# Patient Record
Sex: Female | Born: 1955 | Race: White | Hispanic: No | Smoking: Never smoker
Health system: Southern US, Community
[De-identification: ages and names within clinical notes are randomized; demographics above are authoritative.]

## PROBLEM LIST (undated history)

## (undated) DIAGNOSIS — M1990 Unspecified osteoarthritis, unspecified site: Secondary | ICD-10-CM

## (undated) DIAGNOSIS — I10 Essential (primary) hypertension: Secondary | ICD-10-CM

## (undated) DIAGNOSIS — K219 Gastro-esophageal reflux disease without esophagitis: Secondary | ICD-10-CM

## (undated) DIAGNOSIS — D649 Anemia, unspecified: Secondary | ICD-10-CM

## (undated) DIAGNOSIS — Z8489 Family history of other specified conditions: Secondary | ICD-10-CM

## (undated) DIAGNOSIS — R011 Cardiac murmur, unspecified: Secondary | ICD-10-CM

---

## 2021-03-12 ENCOUNTER — Other Ambulatory Visit: Payer: Self-pay | Admitting: Orthopedic Surgery

## 2021-03-12 DIAGNOSIS — M19012 Primary osteoarthritis, left shoulder: Secondary | ICD-10-CM

## 2021-04-04 ENCOUNTER — Other Ambulatory Visit: Payer: Self-pay

## 2021-04-04 ENCOUNTER — Ambulatory Visit
Admission: RE | Admit: 2021-04-04 | Discharge: 2021-04-04 | Disposition: A | Discharge status: Home / Self Care | Payer: Medicare Other | Source: Ambulatory Visit | Attending: Orthopedic Surgery | Admitting: Orthopedic Surgery

## 2021-04-04 DIAGNOSIS — M19012 Primary osteoarthritis, left shoulder: Secondary | ICD-10-CM

## 2021-05-15 NOTE — Progress Notes (Signed)
Anesthesia Review:  PCP: Cardiologist : Chest x-ray : EKG : Echo : Stress test: Cardiac Cath :  Activity level:  Sleep Study/ CPAP : Fasting Blood Sugar :      / Checks Blood Sugar -- times a day:   Blood Thinner/ Instructions /Last Dose: ASA / Instructions/ Last Dose :   No covid test ambulatory surgery.

## 2021-05-16 NOTE — Progress Notes (Signed)
DUE TO COVID-19 ONLY ONE VISITOR IS ALLOWED TO COME WITH YOU AND STAY IN THE WAITING ROOM ONLY DURING PRE OP AND PROCEDURE DAY OF SURGERY.  2 VISITOR  MAY VISIT WITH YOU AFTER SURGERY IN YOUR PRIVATE ROOM DURING VISITING HOURS ONLY! ?YOU MAY HAVE ONE PERSON SPEND THE NITE WITH YOU IN YOUR ROOM AFTER SURGERY.   ? ? ? Your procedure is scheduled on:  ?        05/30/21  ? Report to [facility] Main  Entrance ? ? Report to admitting at     1000am  ?DO NOT BRING INSURANCE CARD, PICTURE ID OR WALLET DAY OF SURGERY.  ?  ? ? Call this number if you have problems the morning of surgery [phone]  ? ? REMEMBER: NO  SOLID FOODS , CANDY, GUM OR MINTS AFTER MIDNITE THE NITE BEFORE SURGERY .       Marland Kitchen CLEAR LIQUIDS UNTIL        0950am         DAY OF SURGERY.      PLEASE FINISH ENSURE DRINK PER SURGEON ORDER  WHICH NEEDS TO BE COMPLETED AT  0950am        MORNING OF SURGERY.   ? ? ? ? ?CLEAR LIQUID DIET ? ? ?Foods Allowed      ?WATER ?BLACK COFFEE ( SUGAR OK, NO MILK, CREAM OR CREAMER) REGULAR AND DECAF  ?TEA ( SUGAR OK NO MILK, CREAM, OR CREAMER) REGULAR AND DECAF  ?PLAIN JELLO ( NO RED)  ?FRUIT ICES ( NO RED, NO FRUIT PULP)  ?POPSICLES ( NO RED)  ?JUICE- APPLE, WHITE GRAPE AND WHITE CRANBERRY  ?SPORT DRINK LIKE GATORADE ( NO RED)  ?CLEAR BROTH ( VEGETABLE , CHICKEN OR BEEF)                                                               ? ?    ? ?BRUSH YOUR TEETH MORNING OF SURGERY AND RINSE YOUR MOUTH OUT, NO CHEWING GUM CANDY OR MINTS. ?  ? ? Take these medicines the morning of surgery with A SIP OF WATER:  none  ? ? ?DO NOT TAKE ANY DIABETIC MEDICATIONS DAY OF YOUR SURGERY ?                  ?            You may not have any metal on your body including hair pins and  ?            piercings  Do not wear jewelry, make-up, lotions, powders or perfumes, deodorant ?            Do not wear nail polish on your fingernails.   ?           IF YOU ARE A FEMALE AND WANT TO SHAVE UNDER ARMS OR LEGS PRIOR TO SURGERY YOU MUST DO SO  AT LEAST 48 HOURS PRIOR TO SURGERY.  ?            Men may shave face and neck. ? ? Do not bring valuables to the hospital. [place] NOT ?            RESPONSIBLE   FOR VALUABLES. ? Contacts, dentures or bridgework  may not be worn into surgery. ? Leave suitcase in the car. After surgery it may be brought to your room. ? ?  ? Patients discharged the day of surgery will not be allowed to drive home. IF YOU ARE HAVING SURGERY AND GOING HOME THE SAME DAY, YOU MUST HAVE AN ADULT TO DRIVE YOU HOME AND BE WITH YOU FOR 24 HOURS. YOU MAY GO HOME BY TAXI OR UBER OR ORTHERWISE, BUT AN ADULT MUST ACCOMPANY YOU HOME AND STAY WITH YOU FOR 24 HOURS. ?  ? ?            Please read over the following fact sheets you were given: ?_____________________________________________________________________ ? ?[place] - Preparing for Surgery ?Before surgery, you can play an important role.  Because skin is not sterile, your skin needs to be as free of germs as possible.  You can reduce the number of germs on your skin by washing with CHG (chlorahexidine gluconate) soap before surgery.  CHG is an antiseptic cleaner which kills germs and bonds with the skin to continue killing germs even after washing. ?Please DO NOT use if you have an allergy to CHG or antibacterial soaps.  If your skin becomes reddened/irritated stop using the CHG and inform your nurse when you arrive at Short Stay. ?Do not shave (including legs and underarms) for at least 48 hours prior to the first CHG shower.  You may shave your face/neck. ?Please follow these instructions carefully: ? 1.  Shower with CHG Soap the night before surgery and the  morning of Surgery. ? 2.  If you choose to wash your hair, wash your hair first as usual with your  normal  shampoo. ? 3.  After you shampoo, rinse your hair and body thoroughly to remove the  shampoo.                           4.  Use CHG as you would any other liquid soap.  You can apply chg directly  to the skin and wash  ?                      Gently with a scrungie or clean washcloth. ? 5.  Apply the CHG Soap to your body ONLY FROM THE NECK DOWN.   Do not use on face/ open      ?                     Wound or open sores. Avoid contact with eyes, ears mouth and genitals (private parts).  ?                     Production manager,  Genitals (private parts) with your normal soap. ?            6.  Wash thoroughly, paying special attention to the area where your surgery  will be performed. ? 7.  Thoroughly rinse your body with warm water from the neck down. ? 8.  DO NOT shower/wash with your normal soap after using and rinsing off  the CHG Soap. ?               9.  Pat yourself dry with a clean towel. ?           10.  Wear clean pajamas. ?           11.  Place clean sheets on  your bed the night of your first shower and do not  sleep with pets. ?Day of Surgery : ?Do not apply any lotions/deodorants the morning of surgery.  Please wear clean clothes to the hospital/surgery center. ? ?FAILURE TO FOLLOW THESE INSTRUCTIONS MAY RESULT IN THE CANCELLATION OF YOUR SURGERY ?PATIENT SIGNATURE_________________________________ ? ?NURSE SIGNATURE__________________________________ ? ?________________________________________________________________________  ? ? ?           ?

## 2021-05-16 NOTE — Progress Notes (Signed)
[place]- Preparing for Total Shoulder Arthroplasty  °  °Before surgery, you can play an important role. Because skin is not sterile, your skin needs to be as free of germs as possible. You can reduce the number of germs on your skin by using the following products. °Benzoyl Peroxide Gel °Reduces the number of germs present on the skin °Applied twice a day to shoulder area starting two days before surgery   ° °================================================================== ° °Please follow these instructions carefully: ° °BENZOYL PEROXIDE 5% GEL ° °Please do not use if you have an allergy to benzoyl peroxide.   If your skin becomes reddened/irritated stop using the benzoyl peroxide. ° °Starting two days before surgery, apply as follows: °Apply benzoyl peroxide in the morning and at night. Apply after taking a shower. If you are not taking a shower clean entire shoulder front, back, and side along with the armpit with a clean wet washcloth. ° °Place a quarter-sized dollop on your shoulder and rub in thoroughly, making sure to cover the front, back, and side of your shoulder, along with the armpit.  ° °2 days before ____ AM   ____ PM              1 day before ____ AM   ____ PM °                        °Do this twice a day for two days.  (Last application is the night before surgery, AFTER using the CHG soap as described below). ° °Do NOT apply benzoyl peroxide gel on the day of surgery.  °

## 2021-05-22 ENCOUNTER — Other Ambulatory Visit: Payer: Self-pay

## 2021-05-22 ENCOUNTER — Encounter (INDEPENDENT_AMBULATORY_CARE_PROVIDER_SITE_OTHER): Payer: Self-pay

## 2021-05-22 ENCOUNTER — Encounter (HOSPITAL_COMMUNITY)
Admission: RE | Admit: 2021-05-22 | Discharge: 2021-05-22 | Disposition: A | Discharge status: Home / Self Care | Payer: Medicare Other | Source: Ambulatory Visit | Attending: Orthopedic Surgery | Admitting: Orthopedic Surgery

## 2021-05-22 ENCOUNTER — Encounter (HOSPITAL_COMMUNITY): Payer: Self-pay

## 2021-05-22 VITALS — BP 147/73 | HR 61 | Temp 98.1°F | Resp 16 | Ht 68.0 in | Wt 242.0 lb

## 2021-05-22 DIAGNOSIS — Z01818 Encounter for other preprocedural examination: Secondary | ICD-10-CM | POA: Insufficient documentation

## 2021-05-22 HISTORY — DX: Anemia, unspecified: D64.9

## 2021-05-22 HISTORY — DX: Unspecified osteoarthritis, unspecified site: M19.90

## 2021-05-22 HISTORY — DX: Cardiac murmur, unspecified: R01.1

## 2021-05-22 HISTORY — DX: Family history of other specified conditions: Z84.89

## 2021-05-22 HISTORY — DX: Gastro-esophageal reflux disease without esophagitis: K21.9

## 2021-05-22 HISTORY — DX: Essential (primary) hypertension: I10

## 2021-05-22 LAB — CBC
HCT: 36.2 % (ref 36.0–46.0)
Hemoglobin: 10.9 g/dL — ABNORMAL LOW (ref 12.0–15.0)
MCH: 23.8 pg — ABNORMAL LOW (ref 26.0–34.0)
MCHC: 30.1 g/dL (ref 30.0–36.0)
MCV: 79 fL — ABNORMAL LOW (ref 80.0–100.0)
Platelets: 224 10*3/uL (ref 150–400)
RBC: 4.58 MIL/uL (ref 3.87–5.11)
RDW: 16.6 % — ABNORMAL HIGH (ref 11.5–15.5)
WBC: 6.6 10*3/uL (ref 4.0–10.5)
nRBC: 0 % (ref 0.0–0.2)

## 2021-05-22 LAB — BASIC METABOLIC PANEL
Anion gap: 7 (ref 5–15)
BUN: 23 mg/dL (ref 8–23)
CO2: 29 mmol/L (ref 22–32)
Calcium: 9 mg/dL (ref 8.9–10.3)
Chloride: 100 mmol/L (ref 98–111)
Creatinine, Ser: 0.86 mg/dL (ref 0.44–1.00)
GFR, Estimated: >60 mL/min (ref >60)
Glucose, Bld: 91 mg/dL (ref 70–99)
Potassium: 3.9 mmol/L (ref 3.5–5.1)
Sodium: 136 mmol/L (ref 135–145)

## 2021-05-22 LAB — SURGICAL PCR SCREEN
MRSA, PCR: NEGATIVE
Staphylococcus aureus: NEGATIVE

## 2021-05-30 ENCOUNTER — Encounter (HOSPITAL_COMMUNITY): Payer: Self-pay | Admitting: Orthopedic Surgery

## 2021-05-30 ENCOUNTER — Ambulatory Visit (HOSPITAL_COMMUNITY)
Admission: RE | Admit: 2021-05-30 | Discharge: 2021-05-30 | Disposition: A | Discharge status: Home / Self Care | Payer: Medicare Other | Attending: Orthopedic Surgery | Admitting: Orthopedic Surgery

## 2021-05-30 ENCOUNTER — Ambulatory Visit (HOSPITAL_BASED_OUTPATIENT_CLINIC_OR_DEPARTMENT_OTHER): Payer: Medicare Other | Admitting: Anesthesiology

## 2021-05-30 ENCOUNTER — Encounter (HOSPITAL_COMMUNITY)
Admission: RE | Disposition: A | Discharge status: Home / Self Care | Payer: Self-pay | Source: Home / Self Care | Attending: Orthopedic Surgery

## 2021-05-30 ENCOUNTER — Other Ambulatory Visit: Payer: Self-pay

## 2021-05-30 ENCOUNTER — Ambulatory Visit (HOSPITAL_COMMUNITY): Payer: Medicare Other | Admitting: Physician Assistant

## 2021-05-30 DIAGNOSIS — E669 Obesity, unspecified: Secondary | ICD-10-CM | POA: Insufficient documentation

## 2021-05-30 DIAGNOSIS — M19012 Primary osteoarthritis, left shoulder: Secondary | ICD-10-CM | POA: Diagnosis present

## 2021-05-30 DIAGNOSIS — I10 Essential (primary) hypertension: Secondary | ICD-10-CM | POA: Diagnosis not present

## 2021-05-30 DIAGNOSIS — K219 Gastro-esophageal reflux disease without esophagitis: Secondary | ICD-10-CM | POA: Diagnosis not present

## 2021-05-30 DIAGNOSIS — Z6837 Body mass index (BMI) 37.0-37.9, adult: Secondary | ICD-10-CM | POA: Diagnosis not present

## 2021-05-30 DIAGNOSIS — Z79899 Other long term (current) drug therapy: Secondary | ICD-10-CM | POA: Insufficient documentation

## 2021-05-30 HISTORY — PX: TOTAL SHOULDER ARTHROPLASTY: SHX126

## 2021-05-30 SURGERY — ARTHROPLASTY, SHOULDER, TOTAL
Anesthesia: Regional | Site: Shoulder | Laterality: Left

## 2021-05-30 MED ORDER — ONDANSETRON HCL 4 MG/2ML IJ SOLN
INTRAMUSCULAR | Status: AC
  Filled 2021-05-30: qty 2

## 2021-05-30 MED ORDER — ROCURONIUM BROMIDE 10 MG/ML (PF) SYRINGE
PREFILLED_SYRINGE | INTRAVENOUS | Status: AC
  Filled 2021-05-30: qty 10

## 2021-05-30 MED ORDER — PHENYLEPHRINE HCL (PRESSORS) 10 MG/ML IV SOLN
INTRAVENOUS | Status: AC
  Filled 2021-05-30: qty 1

## 2021-05-30 MED ORDER — NAPROXEN 500 MG PO TABS: 500.0000 mg | ORAL_TABLET | Freq: Two times a day (BID) | ORAL | 1 refills | Status: AC

## 2021-05-30 MED ORDER — TRANEXAMIC ACID-NACL 1000-0.7 MG/100ML-% IV SOLN
1000.0000 mg | INTRAVENOUS | Status: AC
  Administered 2021-05-30: 1000 mg via INTRAVENOUS
  Filled 2021-05-30: qty 100

## 2021-05-30 MED ORDER — DEXAMETHASONE SODIUM PHOSPHATE 10 MG/ML IJ SOLN
INTRAMUSCULAR | Status: DC | PRN
  Administered 2021-05-30: 10 mg via INTRAVENOUS

## 2021-05-30 MED ORDER — ROCURONIUM BROMIDE 10 MG/ML (PF) SYRINGE
PREFILLED_SYRINGE | INTRAVENOUS | Status: DC | PRN
  Administered 2021-05-30: 100 mg via INTRAVENOUS

## 2021-05-30 MED ORDER — HYDROMORPHONE HCL 1 MG/ML IJ SOLN: 0.2500 mg | INTRAMUSCULAR | Status: DC | PRN

## 2021-05-30 MED ORDER — PROPOFOL 10 MG/ML IV BOLUS
INTRAVENOUS | Status: AC
  Filled 2021-05-30: qty 20

## 2021-05-30 MED ORDER — METHOCARBAMOL 500 MG IVPB - SIMPLE MED
INTRAVENOUS | Status: DC
Start: 2021-05-30 — End: 2021-05-30
  Filled 2021-05-30: qty 50

## 2021-05-30 MED ORDER — DEXAMETHASONE SODIUM PHOSPHATE 10 MG/ML IJ SOLN
INTRAMUSCULAR | Status: AC
  Filled 2021-05-30: qty 1

## 2021-05-30 MED ORDER — PHENYLEPHRINE HCL (PRESSORS) 10 MG/ML IV SOLN
INTRAVENOUS | Status: DC | PRN
  Administered 2021-05-30 (×2): 80 ug via INTRAVENOUS

## 2021-05-30 MED ORDER — TRANEXAMIC ACID 1000 MG/10ML IV SOLN: 1000.0000 mg | INTRAVENOUS | Status: DC

## 2021-05-30 MED ORDER — PROPOFOL 10 MG/ML IV BOLUS
INTRAVENOUS | Status: DC | PRN
  Administered 2021-05-30: 150 mg via INTRAVENOUS

## 2021-05-30 MED ORDER — ONDANSETRON HCL 4 MG/2ML IJ SOLN: 4.0000 mg | Freq: Once | INTRAMUSCULAR | Status: DC | PRN

## 2021-05-30 MED ORDER — ONDANSETRON HCL 4 MG PO TABS: 4.0000 mg | ORAL_TABLET | Freq: Three times a day (TID) | ORAL | 0 refills | Status: AC | PRN

## 2021-05-30 MED ORDER — KETOROLAC TROMETHAMINE 15 MG/ML IJ SOLN
INTRAMUSCULAR | Status: AC
  Administered 2021-05-30: 15 mg via INTRAVENOUS
  Filled 2021-05-30: qty 1

## 2021-05-30 MED ORDER — LIDOCAINE 2% (20 MG/ML) 5 ML SYRINGE
INTRAMUSCULAR | Status: DC | PRN
  Administered 2021-05-30: 20 mg via INTRAVENOUS

## 2021-05-30 MED ORDER — ACETAMINOPHEN 500 MG PO TABS
ORAL_TABLET | ORAL | Status: AC
  Filled 2021-05-30: qty 2

## 2021-05-30 MED ORDER — ORAL CARE MOUTH RINSE
15.0000 mL | Freq: Once | OROMUCOSAL | Status: AC
Start: 2021-05-30 — End: 2021-05-30

## 2021-05-30 MED ORDER — LACTATED RINGERS IV BOLUS
500.0000 mL | Freq: Once | INTRAVENOUS | Status: AC
  Administered 2021-05-30: 500 mL via INTRAVENOUS

## 2021-05-30 MED ORDER — CYCLOBENZAPRINE HCL 10 MG PO TABS: 10.0000 mg | ORAL_TABLET | Freq: Three times a day (TID) | ORAL | 1 refills | Status: AC | PRN

## 2021-05-30 MED ORDER — LIDOCAINE HCL (PF) 2 % IJ SOLN
INTRAMUSCULAR | Status: AC
  Filled 2021-05-30: qty 5

## 2021-05-30 MED ORDER — 0.9 % SODIUM CHLORIDE (POUR BTL) OPTIME
TOPICAL | Status: DC | PRN
  Administered 2021-05-30: 1000 mL

## 2021-05-30 MED ORDER — TRAMADOL HCL 50 MG PO TABS: 50.0000 mg | ORAL_TABLET | Freq: Once | ORAL | Status: DC

## 2021-05-30 MED ORDER — LACTATED RINGERS IV BOLUS
250.0000 mL | Freq: Once | INTRAVENOUS | Status: AC
  Administered 2021-05-30: 250 mL via INTRAVENOUS

## 2021-05-30 MED ORDER — SUGAMMADEX SODIUM 200 MG/2ML IV SOLN
INTRAVENOUS | Status: DC | PRN
  Administered 2021-05-30: 200 mg via INTRAVENOUS

## 2021-05-30 MED ORDER — VANCOMYCIN HCL 1000 MG IV SOLR
INTRAVENOUS | Status: AC
  Filled 2021-05-30: qty 20

## 2021-05-30 MED ORDER — TRAMADOL HCL 50 MG PO TABS: 50.0000 mg | ORAL_TABLET | Freq: Four times a day (QID) | ORAL | 0 refills | Status: AC | PRN

## 2021-05-30 MED ORDER — BUPIVACAINE HCL (PF) 0.5 % IJ SOLN
INTRAMUSCULAR | Status: DC | PRN
  Administered 2021-05-30: 15 mL via PERINEURAL

## 2021-05-30 MED ORDER — CHLORHEXIDINE GLUCONATE 0.12 % MT SOLN
15.0000 mL | Freq: Once | OROMUCOSAL | Status: AC
  Administered 2021-05-30: 15 mL via OROMUCOSAL

## 2021-05-30 MED ORDER — METHOCARBAMOL 500 MG IVPB - SIMPLE MED
500.0000 mg | Freq: Once | INTRAVENOUS | Status: AC
  Administered 2021-05-30: 500 mg via INTRAVENOUS

## 2021-05-30 MED ORDER — LACTATED RINGERS IV SOLN: INTRAVENOUS | Status: DC

## 2021-05-30 MED ORDER — MIDAZOLAM HCL 2 MG/2ML IJ SOLN
1.0000 mg | INTRAMUSCULAR | Status: DC
  Administered 2021-05-30: 1 mg via INTRAVENOUS
  Filled 2021-05-30: qty 2

## 2021-05-30 MED ORDER — BUPIVACAINE LIPOSOME 1.3 % IJ SUSP
INTRAMUSCULAR | Status: DC | PRN
  Administered 2021-05-30: 10 mL via PERINEURAL

## 2021-05-30 MED ORDER — FENTANYL CITRATE PF 50 MCG/ML IJ SOSY
50.0000 ug | PREFILLED_SYRINGE | INTRAMUSCULAR | Status: DC
  Administered 2021-05-30: 50 ug via INTRAVENOUS
  Filled 2021-05-30: qty 2

## 2021-05-30 MED ORDER — VANCOMYCIN HCL 1000 MG IV SOLR
INTRAVENOUS | Status: DC | PRN
  Administered 2021-05-30: 1000 mg

## 2021-05-30 MED ORDER — AMISULPRIDE (ANTIEMETIC) 5 MG/2ML IV SOLN: 10.0000 mg | Freq: Once | INTRAVENOUS | Status: DC | PRN

## 2021-05-30 MED ORDER — HYDROCODONE-ACETAMINOPHEN 7.5-325 MG PO TABS: 1.0000 | ORAL_TABLET | Freq: Once | ORAL | Status: DC | PRN

## 2021-05-30 MED ORDER — KETOROLAC TROMETHAMINE 15 MG/ML IJ SOLN: 15.0000 mg | Freq: Once | INTRAMUSCULAR | Status: AC

## 2021-05-30 MED ORDER — HYDROCODONE-ACETAMINOPHEN 10-325 MG PO TABS: 1.0000 | ORAL_TABLET | Freq: Four times a day (QID) | ORAL | 0 refills | Status: AC | PRN

## 2021-05-30 MED ORDER — STERILE WATER FOR IRRIGATION IR SOLN
Status: DC | PRN
Start: 2021-05-30 — End: 2021-05-30
  Administered 2021-05-30: 1000 mL

## 2021-05-30 MED ORDER — ONDANSETRON HCL 4 MG/2ML IJ SOLN
INTRAMUSCULAR | Status: DC | PRN
  Administered 2021-05-30: 4 mg via INTRAVENOUS

## 2021-05-30 MED ORDER — CEFAZOLIN SODIUM-DEXTROSE 2-4 GM/100ML-% IV SOLN
2.0000 g | INTRAVENOUS | Status: AC
  Administered 2021-05-30: 2 g via INTRAVENOUS
  Filled 2021-05-30: qty 100

## 2021-05-30 SURGICAL SUPPLY — 70 items
BAG COUNTER SPONGE SURGICOUNT (BAG) IMPLANT
BAG ZIPLOCK 12X15 (MISCELLANEOUS) ×2 IMPLANT
BLADE SAW SGTL 83.5X18.5 (BLADE) ×2 IMPLANT
BODY TRUNION ECLIPSE 39 SL (Shoulder) ×1 IMPLANT
CALIBRATOR GLENOID VIP 5-D (SYSTAGENIX WOUND MANAGEMENT) ×1 IMPLANT
CEMENT BONE DEPUY (Cement) ×2 IMPLANT
COOLER ICEMAN CLASSIC (MISCELLANEOUS) ×2 IMPLANT
COVER BACK TABLE 60X90IN (DRAPES) ×2 IMPLANT
COVER SURGICAL LIGHT HANDLE (MISCELLANEOUS) ×2 IMPLANT
DERMABOND ADVANCED (GAUZE/BANDAGES/DRESSINGS) ×1
DERMABOND ADVANCED .7 DNX12 (GAUZE/BANDAGES/DRESSINGS) ×1 IMPLANT
DRAPE ORTHO SPLIT 77X108 STRL (DRAPES) ×2
DRAPE SHEET LG 3/4 BI-LAMINATE (DRAPES) ×2 IMPLANT
DRAPE SURG 17X11 SM STRL (DRAPES) ×2 IMPLANT
DRAPE SURG ORHT 6 SPLT 77X108 (DRAPES) ×2 IMPLANT
DRAPE TOP 10253 STERILE (DRAPES) ×2 IMPLANT
DRAPE U-SHAPE 47X51 STRL (DRAPES) ×2 IMPLANT
DRSG AQUACEL AG ADV 3.5X 6 (GAUZE/BANDAGES/DRESSINGS) ×1 IMPLANT
DRSG AQUACEL AG ADV 3.5X10 (GAUZE/BANDAGES/DRESSINGS) ×2 IMPLANT
DRSG TEGADERM 8X12 (GAUZE/BANDAGES/DRESSINGS) ×2 IMPLANT
DURAPREP 26ML APPLICATOR (WOUND CARE) ×2 IMPLANT
ELECT BLADE TIP CTD 4 INCH (ELECTRODE) ×2 IMPLANT
ELECT PENCIL ROCKER SW 15FT (MISCELLANEOUS) ×2 IMPLANT
ELECT REM PT RETURN 15FT ADLT (MISCELLANEOUS) ×2 IMPLANT
FACESHIELD WRAPAROUND (MASK) ×8 IMPLANT
FACESHIELD WRAPAROUND OR TEAM (MASK) ×4 IMPLANT
GLENOID WITH CLEAT MEDIUM (Shoulder) ×1 IMPLANT
GLOVE SRG 8 PF TXTR STRL LF DI (GLOVE) ×1 IMPLANT
GLOVE SURG ENC MOIS LTX SZ7 (GLOVE) ×2 IMPLANT
GLOVE SURG ENC MOIS LTX SZ7.5 (GLOVE) ×2 IMPLANT
GLOVE SURG UNDER POLY LF SZ7 (GLOVE) ×2 IMPLANT
GLOVE SURG UNDER POLY LF SZ8 (GLOVE) ×1
GOWN STRL REUS W/ TWL LRG LVL3 (GOWN DISPOSABLE) ×2 IMPLANT
GOWN STRL REUS W/TWL LRG LVL3 (GOWN DISPOSABLE) ×2
HEAD HUMERAL ECLIPSE 39/18 (Shoulder) ×1 IMPLANT
IMPL ECLIPSE SPEEDCAP (Shoulder) IMPLANT
IMPLANT ECLIPSE SPEEDCAP (Shoulder) ×2 IMPLANT
KIT BASIN OR (CUSTOM PROCEDURE TRAY) ×2 IMPLANT
KIT TURNOVER KIT A (KITS) IMPLANT
MANIFOLD NEPTUNE II (INSTRUMENTS) ×2 IMPLANT
NDL TAPERED W/ NITINOL LOOP (MISCELLANEOUS) IMPLANT
NEEDLE TAPERED W/ NITINOL LOOP (MISCELLANEOUS) IMPLANT
NS IRRIG 1000ML POUR BTL (IV SOLUTION) ×2 IMPLANT
PACK SHOULDER (CUSTOM PROCEDURE TRAY) ×2 IMPLANT
PAD COLD SHLDR WRAP-ON (PAD) ×2 IMPLANT
PIN NITINOL TARGETER 2.8 (PIN) ×1 IMPLANT
PIN SET MODULAR GLENOID SYSTEM (PIN) IMPLANT
PROTECTOR NERVE ULNAR (MISCELLANEOUS) ×2 IMPLANT
RESTRAINT HEAD UNIVERSAL NS (MISCELLANEOUS) ×2 IMPLANT
SCREW MED ECLIPSE 35 (Screw) ×1 IMPLANT
SLING ARM FOAM STRAP LRG (SOFTGOODS) ×1 IMPLANT
SLING ARM FOAM STRAP MED (SOFTGOODS) IMPLANT
SMARTMIX MINI TOWER (MISCELLANEOUS)
SPONGE T-LAP 18X18 ~~LOC~~+RFID (SPONGE) ×2 IMPLANT
SPONGE T-LAP 4X18 ~~LOC~~+RFID (SPONGE) ×4 IMPLANT
SUCTION FRAZIER HANDLE 12FR (TUBING) ×1
SUCTION TUBE FRAZIER 12FR DISP (TUBING) ×1 IMPLANT
SUT FIBERWIRE #2 38 T-5 BLUE (SUTURE)
SUT MNCRL AB 3-0 PS2 18 (SUTURE) ×2 IMPLANT
SUT MON AB 2-0 CT1 36 (SUTURE) ×2 IMPLANT
SUT VIC AB 1 CT1 36 (SUTURE) ×2 IMPLANT
SUTURE FIBERWR #2 38 T-5 BLUE (SUTURE) IMPLANT
SUTURE TAPE 1.3 40 TPR END (SUTURE) IMPLANT
SUTURETAPE 1.3 40 TPR END (SUTURE)
TOWEL OR 17X26 10 PK STRL BLUE (TOWEL DISPOSABLE) ×2 IMPLANT
TOWEL OR NON WOVEN STRL DISP B (DISPOSABLE) ×2 IMPLANT
TOWER SMARTMIX MINI (MISCELLANEOUS) IMPLANT
TUBE SUCTION HIGH CAP CLEAR NV (SUCTIONS) ×2 IMPLANT
WATER STERILE IRR 1000ML POUR (IV SOLUTION) ×4 IMPLANT
YANKAUER SUCT BULB TIP 10FT TU (MISCELLANEOUS) ×2 IMPLANT

## 2021-05-30 NOTE — Anesthesia Preprocedure Evaluation (Addendum)
Anesthesia Evaluation  ?Patient identified by MRN, date of birth, ID band ?Patient awake ? ? ? ?Reviewed: ?Allergy & Precautions, NPO status , Patient's Chart, lab work & pertinent test results ? ?Airway ?Mallampati: III ? ?TM Distance: >3 FB ?Neck ROM: Full ? ? ? Dental ? ?(+) Teeth Intact, Dental Advisory Given ?  ?Pulmonary ? ?Snores at night, has never had sleep study  ?  ?Pulmonary exam normal ?breath sounds clear to auscultation ? ? ? ? ? ? Cardiovascular ?hypertension, Pt. on medications ?Normal cardiovascular exam ?Rhythm:Regular Rate:Normal ? ? ?  ?Neuro/Psych ?negative neurological ROS ? negative psych ROS  ? GI/Hepatic ?Neg liver ROS, GERD  Medicated and Controlled,  ?Endo/Other  ?BMI 37 ? Renal/GU ?negative Renal ROS  ?negative genitourinary ?  ?Musculoskeletal ? ?(+) Arthritis , Osteoarthritis,  L shoulder OA   ? Abdominal ?(+) + obese,   ?Peds ? Hematology ?negative hematology ROS ?(+) Hb 10.9   ?Anesthesia Other Findings ? ? Reproductive/Obstetrics ?negative OB ROS ? ?  ? ? ? ? ? ? ? ? ? ? ? ? ? ?  ?  ? ? ? ? ? ? ? ?Anesthesia Physical ?Anesthesia Plan ? ?ASA: 3 ? ?Anesthesia Plan: General and Regional  ? ?Post-op Pain Management: Regional block*  ? ?Induction: Intravenous ? ?PONV Risk Score and Plan: 3 and Ondansetron, Dexamethasone, Midazolam and Treatment may vary due to age or medical condition ? ?Airway Management Planned: Oral ETT ? ?Additional Equipment: None ? ?Intra-op Plan:  ? ?Post-operative Plan: Extubation in OR ? ?Informed Consent: I have reviewed the patients History and Physical, chart, labs and discussed the procedure including the risks, benefits and alternatives for the proposed anesthesia with the patient or authorized representative who has indicated his/her understanding and acceptance.  ? ? ? ?Dental advisory given ? ?Plan Discussed with: CRNA ? ?Anesthesia Plan Comments:   ? ? ? ? ? ?Anesthesia Quick Evaluation ? ?

## 2021-05-30 NOTE — Anesthesia Procedure Notes (Signed)
Procedure Name: Intubation ?Date/Time: 05/30/2021 10:05 AM ?Performed by: [place] Christabell Loseke C, CRNA ?Pre-anesthesia Checklist: Patient identified, Emergency Drugs available, Suction available and Patient being monitored ?Patient Re-evaluated:Patient Re-evaluated prior to induction ?Oxygen Delivery Method: Circle system utilized ?Preoxygenation: Pre-oxygenation with 100% oxygen ?Induction Type: IV induction ?Ventilation: Mask ventilation without difficulty ?Laryngoscope Size: Mac and 3 ?Grade View: Grade II ?Tube type: Oral ?Tube size: 7.0 mm ?Number of attempts: 1 ?Airway Equipment and Method: Stylet and Oral airway ?Placement Confirmation: ETT inserted through vocal cords under direct vision, positive ETCO2 and breath sounds checked- equal and bilateral ?Secured at: 20 cm ?Tube secured with: Tape ?Dental Injury: Teeth and Oropharynx as per pre-operative assessment  ? ? ? ? ?

## 2021-05-30 NOTE — Addendum Note (Signed)
Addendum  created 05/30/21 1258 by Lannie Fields, DO  ? Order list changed, Pharmacy for encounter modified  ?  ?

## 2021-05-30 NOTE — Transfer of Care (Signed)
Immediate Anesthesia Transfer of Care Note ? ?Patient: Margaret Shaw ? ?Procedure(s) Performed: TOTAL SHOULDER ARTHROPLASTY (Left: Shoulder) ? ?Patient Location: PACU ? ?Anesthesia Type:GA combined with regional for post-op pain ? ?Level of Consciousness: awake, alert  and oriented ? ?Airway & Oxygen Therapy: Patient Spontanous Breathing and Patient connected to face mask oxygen ? ?Post-op Assessment: Report given to RN and Post -op Vital signs reviewed and stable ? ?Post vital signs: Reviewed and stable ? ?Last Vitals:  ?Vitals Value Taken Time  ?BP    ?Temp    ?Pulse    ?Resp    ?SpO2    ? ? ?Last Pain:  ?Vitals:  ? 05/30/21 0821  ?TempSrc:   ?PainSc: 0-No pain  ?   ? ?Patients Stated Pain Goal: 4 (05/30/21 9563) ? ?Complications: No notable events documented. ?

## 2021-05-30 NOTE — Anesthesia Procedure Notes (Signed)
Anesthesia Regional Block: Interscalene brachial plexus block  ? ?Pre-Anesthetic Checklist: , timeout performed,  Correct Patient, Correct Site, Correct Laterality,  Correct Procedure, Correct Position, site marked,  Risks and benefits discussed,  Surgical consent,  Pre-op evaluation,  At surgeon's request and post-op pain management ? ?Laterality: Left ? ?Prep: Maximum Sterile Barrier Precautions used, chloraprep     ?  ?Needles:  ?Injection technique: Single-shot ? ?Needle Type: Echogenic Stimulator Needle   ? ? ?Needle Length: 9cm  ?Needle Gauge: 22  ? ? ? ?Additional Needles: ? ? ?Procedures:,,,, ultrasound used (permanent image in chart),,    ?Narrative:  ?Start time: 05/30/2021 9:15 AM ?End time: 05/30/2021 9:20 AM ?Injection made incrementally with aspirations every 5 mL. ? ?Performed by: Personally  ?Anesthesiologist: Lannie Fields, DO ? ?Additional Notes: ?Monitors applied. No increased pain on injection. No increased resistance to injection. Injection made in 5cc increments. Good needle visualization. Patient tolerated procedure well.  ? ? ? ? ?

## 2021-05-30 NOTE — Anesthesia Postprocedure Evaluation (Signed)
Anesthesia Post Note ? ?Patient: Margaret Shaw ? ?Procedure(s) Performed: TOTAL SHOULDER ARTHROPLASTY (Left: Shoulder) ? ?  ? ?Patient location during evaluation: PACU ?Anesthesia Type: Regional and General ?Level of consciousness: awake and alert, oriented and patient cooperative ?Pain management: pain level controlled ?Vital Signs Assessment: post-procedure vital signs reviewed and stable ?Respiratory status: spontaneous breathing, nonlabored ventilation and respiratory function stable ?Cardiovascular status: blood pressure returned to baseline and stable ?Postop Assessment: no apparent nausea or vomiting ?Anesthetic complications: no ? ? ?No notable events documented. ? ?Last Vitals:  ?Vitals:  ? 05/30/21 0925 05/30/21 1149  ?BP:  (!) 151/74  ?Pulse: 73 77  ?Resp: 14 15  ?Temp:  36.4 ?C  ?SpO2: 94% 98%  ?  ?Last Pain:  ?Vitals:  ? 05/30/21 1149  ?TempSrc:   ?PainSc: 3   ? ? ?  ?  ?  ?  ?  ?  ? ?Tennis Must Emanie Behan ? ? ? ? ?

## 2021-05-30 NOTE — Op Note (Signed)
05/30/2021 ? ?11:42 AM ? ?PATIENT:   Margaret Shaw  66 y.o. female ? ?PRE-OPERATIVE DIAGNOSIS:  left shoulder osteoarthritis ? ?POST-OPERATIVE DIAGNOSIS: Same ? ?PROCEDURE: Left shoulder anatomic arthroplasty using a size 39 trunnion, medium cage screw, 39 x 20 humeral head, medium glenoid ? ?SURGEON:  Senaida Lange M.D. ? ?ASSISTANTS: Ralene Bathe, PA-C ? ?ANESTHESIA:   General endotracheal and interscalene block with Exparel ? ?EBL: 150 cc ? ?SPECIMEN: None ? ?Drains: None ? ? ?PATIENT DISPOSITION:  PACU - hemodynamically stable. ? ? ? ?PLAN OF CARE: Discharge to home after PACU ? ?Brief history: ? ?Patient is a 66 year old female who has had chronic and progressively increasing bilateral shoulder pain left much more symptomatic than the right with radiographs confirming severe osteoarthritis.  Due to her increasing functional limitations and failure to respond to prolonged attempts at conservative management, she is brought to the operating at this time for planned left shoulder anatomic arthroplasty. ? ?Preoperatively, I counseled the patient regarding treatment options and risks versus benefits thereof.  Possible surgical complications were all reviewed including potential for bleeding, infection, neurovascular injury, persistent pain, loss of motion, anesthetic complication, failure of the implant, and possible need for additional surgery. They understand and accept and agrees with our planned procedure. ? ? ?Procedure in detail: ? ?After undergoing routine preop evaluation the patient received prophylactic antibiotics and an interscalene block with Exparel was established in the holding area of the anesthesia department.  Subsequently placed supine on the operating table and underwent the smooth induction of a general endotracheal anesthesia.  Placed into the beachchair position and appropriately padded and protected.  The left shoulder girdle region was sterilely prepped and draped in standard fashion.   Timeout was called.  A deltopectoral approach left shoulder is made to an approximately 8 cm incision.  Skin flaps were elevated dissection carried deeply the deltopectoral interval was then developed from proximal to this with the vein taken laterally.  The conjoined tendon was mobilized and retracted medially.  The long head biceps tendon was then tenodesed at the upper border the pectoralis major tendon and the proximal segment was then unroofed and excised.  The rotator cuff was split from the apex of the bicipital groove to the base of the coracoid and the insertion of the subscap was then identified superiorly and inferiorly on the lesser tuberosity and an oscillating saw was then used to perform a lesser tuberosity osteotomy removing a thin wafer of bone.  The subscap was then tagged mobilized and reflected medially.  Capsular attachments on the anterior and inferior margins of the humeral neck were then divided in a subperiosteal fashion. Humeral head was then delivered through the wound.  An extra medullary guide was then used to outline the proposed humeral head resection which we performed with an oscillating saw at approximately 30 degrees retroversion.  A rondure was then used to remove the large osteophytes on the humeral neck.  The proximal humeral metaphysis was then sized at a 39 and preparation performed with the coring reamer and measured for a medium cage screw.  A metal cap was then placed over the cut proximal humeral surface and we then exposed the glenoid with appropriate retractors.  A circumferential labral resection was performed gaining complete visualization of the glenoid.  A preoperative CT scan had been used to create a glenoid guide and we identified the proper alignment and introduced a pin into the glenoid.  The glenoid was then reamed correcting the retroversion as achieving  a stable subchondral bony bed.  Peripheral debris was then carefully removed.  Glenoid preparation  completed with the placement of our central drill and guide for the superior and inferior peg and slot respectively.  Plan was then broached and trial showed excellent fit and fixation.  The glenoid was then meticulously cleaned and dried.  Cement was mixed and introduced into the superior and inferior peg and slot respectively and bone graft was placed about the central peg and the gland was then impacted achieving excellent fit and fixation.  We then returned our attention back to the proximal humerus where we confirmed good bone quality.  A 39 trunnion was then placed over the central guide with a suture looped through the eyelet on the collar to be used for later subscap repair.  The trunnion was impacted and the central cage screw was then packed with bone and inserted achieving excellent purchase and fixation.  We then performed a series of trial reductions and ultimately felt that the 39 x 20 head gave [place] the best motion stability and soft tissue balance.  The trial was then removed.  We placed 2 additional medial row anchors into the metaphysis for the LTO repair.  The trunnion was cleaned and dried and the final head was then impacted into position.  Our final reduction was performed again showing good motion good stability and soft tissue balance much to our satisfaction.  We then confirmed the subscap was appropriately mobilized.  An apex stitch was placed at the convergence of the upper subscap and anterior supraspinatus and we then passed our medial row suture limbs through the bone tendon junction of the subscap and the LTO.  The suture limbs were then passed in an alternating fashion into 2 lateral row anchors placed into the bicipital groove which achieved excellent purchase and fixation.  We then repaired the rotator interval with a series of figure-of-eight suture tape sutures.  Upon completion the arm easily achieved 45 degrees of external rotation without excessive tension on the subscap.  The  wound was then copiously irrigated.  Final hemostasis was obtained.  Vancomycin powder was then spread liberally throughout the deep soft tissue layers.  The deltopectoral interval was reapproximated with a series of figure-of-eight and 1 Vicryl sutures.  2-0 Monocryl used to close the subcu layer and intracuticular 3-0 Monocryl for the skin followed by Dermabond and Aquacel dressing.  Left arm was then placed into a sling.  The patient was awakened, extubated, and taken to the recovery room in stable condition. ? ?Ralene Bathe, PA-C was utilized as an Geophysicist/field seismologist throughout this case, essential for help with positioning the patient, positioning extremity, tissue manipulation, implantation of the prosthesis, suture management, wound closure, and intraoperative decision-making. ? ?Senaida Lange MD ? ? ?Contact # [phone] ? ? ? ? ?

## 2021-05-30 NOTE — Discharge Instructions (Signed)

## 2021-05-30 NOTE — H&P (Signed)
Margaret Shaw   ? ?Chief Complaint: left shoulder osteoarthritis ?HPI: The patient is a 66 y.o. female with chronic and progressively increasing left shoulder pain related to severe osteoarthritis.  Due to her increasing functional limitations and failure to respond to prolonged attempts at conservative management, she is brought to the operating room at this time for planned left shoulder anatomic arthroplasty ? ?Past Medical History:  ?Diagnosis Date  ? Anemia   ? hx of low iron  ? Arthritis   ? Family history of adverse reaction to anesthesia   ? mother- had resp problems and swelling in neck after shoulder surgery but no problems since  ? GERD (gastroesophageal reflux disease)   ? Heart murmur   ? noted 25 years ago no issues per pt  ? Hypertension   ? ? ?History reviewed. No pertinent surgical history. ? ?History reviewed. No pertinent family history. ? ?Social History:  reports that she has never smoked. She has never used smokeless tobacco. She reports that she does not drink alcohol and does not use drugs. ? ? ?Medications Prior to Admission  ?Medication Sig Dispense Refill  ? cetirizine (ZYRTEC) 10 MG tablet Take 10 mg by mouth daily.    ? Cholecalciferol (VITAMIN D3) 1.25 MG (50000 UT) CAPS Take 50,000 Units by mouth once a week.    ? losartan-hydrochlorothiazide (HYZAAR) 100-12.5 MG tablet Take 1 tablet by mouth daily.    ? omeprazole (PRILOSEC) 40 MG capsule Take 40 mg by mouth daily.    ? traMADol (ULTRAM) 50 MG tablet Take 50 mg by mouth daily as needed for pain.    ? ? ? ?Physical Exam: Left shoulder demonstrates painful motion as noted at her recent office visit.  She does however maintain a functional range of motion and has overall good strength to manual muscle testing but has obvious discomfort with manual muscle testing.  She is otherwise neurovascular intact in the left upper extremity. ? ?Plain radiographs confirm severe osteoarthritis with complete obliteration of the joint space, subchondral  sclerosis, and peripheral osteophyte formation. ? ?Preoperative CT scan was obtained to confirm overall bony alignment and to assist with surgical planning ? ?Vitals ? ?Temp:  [97.5 ?F (36.4 ?C)] 97.5 ?F (36.4 ?C) (03/23 0800) ?Pulse Rate:  [89] 89 (03/23 0800) ?Resp:  [16] 16 (03/23 0800) ?BP: (159)/(78) 159/78 (03/23 0800) ?SpO2:  [99 %] 99 % (03/23 0800) ?Weight:  [109.8 kg] 109.8 kg (03/23 0821) ? ?Assessment/Plan ? ?Impression: left shoulder osteoarthritis ? ?Plan of Action: Procedure(s): ?TOTAL SHOULDER ARTHROPLASTY ? ?Margaret Shaw ?05/30/2021, 9:17 AM ?Contact # [phone] ? ? ? ? ? ?  ?

## 2021-05-30 NOTE — Evaluation (Signed)
Occupational Therapy Evaluation ?Patient Details ?Name: Margaret Shaw ?MRN: [id number] ?DOB: 1956/01/12 ?Today's Date: 05/30/2021 ? ? ?History of Present Illness Patient s/p left TSA  ? ?Clinical Impression ?  ?Ms. Aleza Pew is a 66 year old woman s/p shoulder replacement without functional use of left non- dominant upper extremity secondary to effects of surgery and interscalene block and shoulder precautions. Therapist provided education and instruction to patient and niece in regards to exercises, precautions, positioning, donning upper extremity clothing and bathing while maintaining shoulder precautions, ice and edema management and donning/doffing sling. Patient and niece verbalized understanding and demonstrated as needed. Patient needed assistance to donn shirt, underwear, pants, and shoes   Patient will have assistance of son and niece at home. Patient to follow up with MD for further therapy needs.  ?  ?   ? ?Recommendations for follow up therapy are one component of a multi-disciplinary discharge planning process, led by the attending physician.  Recommendations may be updated based on patient status, additional functional criteria and insurance authorization.  ? ?Follow Up Recommendations ? Follow physician's recommendations for discharge plan and follow up therapies  ?  ?Assistance Recommended at Discharge Intermittent Supervision/Assistance  ?Patient can return home with the following A little help with bathing/dressing/bathroom;Assistance with cooking/housework ? ?  ?Functional Status Assessment ? Patient has had a recent decline in their functional status and demonstrates the ability to make significant improvements in function in a reasonable and predictable amount of time.  ?Equipment Recommendations ? None recommended by OT  ?  ?Recommendations for Other Services   ? ? ?  ?Precautions / Restrictions Precautions ?Precautions: Shoulder ?Type of Shoulder Precautions: May come out of sling if sitting  in controlled environment. ie while watching tv, eating etc to give neck and skin break from sling. Please sleep in sling though until 4 weeks post op.     Ok to use operative arm to assist in feeding, bathing, ADL's.       New PROM restrictions (8/18) for use in hygiene and ADL only   ER 20   ABD 45   FE 60     Pendulums are to be gentle and are the preferred exercise to be instructed for patients to perform at home.( Along with elbow wrist and hand exercise) ?Shoulder Interventions: Shoulder sling/immobilizer;Off for dressing/bathing/exercises ?Precaution Booklet Issued:  (handouts) ?Required Braces or Orthoses: Sling ?Restrictions ?Weight Bearing Restrictions: Yes ?LUE Weight Bearing: Non weight bearing  ? ?  ? ?Mobility Bed Mobility ?  ?  ?  ?  ?  ?  ?  ?  ?  ? ?Transfers ?Overall transfer level: Independent ?  ?  ?  ?  ?  ?  ?  ?  ?  ?  ? ?  ?Balance Overall balance assessment: No apparent balance deficits (not formally assessed) ?  ?  ?  ?  ?  ?  ?  ?  ?  ?  ?  ?  ?  ?  ?  ?  ?  ?  ?   ? ?ADL either performed or assessed with clinical judgement  ? ?ADL   ?  ?  ?  ?  ?  ?  ?  ?  ?  ?  ?  ?  ?  ?  ?  ?  ?  ?  ?  ?   ? ? ? ?Vision Baseline Vision/History: 1 Wears glasses ?   ?   ?Perception   ?  ?Praxis   ?  ? ?  Pertinent Vitals/Pain Pain Assessment ?Pain Assessment: No/denies pain  ? ? ? ?Hand Dominance Right ?  ?Extremity/Trunk Assessment Upper Extremity Assessment ?Upper Extremity Assessment: RUE deficits/detail ?RUE Deficits / Details: impaired secondary to block and shoulder precautions ?  ?Lower Extremity Assessment ?Lower Extremity Assessment: Overall WFL for tasks assessed ?  ?Cervical / Trunk Assessment ?Cervical / Trunk Assessment: Normal ?  ?Communication Communication ?Communication: No difficulties ?  ?Cognition Arousal/Alertness: Awake/alert ?Behavior During Therapy: [facility] for tasks assessed/performed ?Overall Cognitive Status: Within Functional Limits for tasks assessed ?  ?  ?  ?  ?  ?  ?  ?  ?  ?  ?   ?  ?  ?  ?  ?  ?  ?  ?  ?General Comments    ? ?  ?Exercises   ?  ?Shoulder Instructions Shoulder Instructions ?Donning/doffing shirt without moving shoulder: Caregiver independent with task ?Method for sponge bathing under operated UE: Independent ?Donning/doffing sling/immobilizer: Independent ?Correct positioning of sling/immobilizer: Independent ?ROM for elbow, wrist and digits of operated UE: Independent ?Sling wearing schedule (on at all times/off for ADL's): Independent ?Proper positioning of operated UE when showering: Independent ?Dressing change: Independent ?Positioning of UE while sleeping: Independent  ? ? ?Home Living Family/patient expects to be discharged to:: Private residence ?Living Arrangements: Children;Other relatives ?Available Help at Discharge: Family;Available 24 hours/day ?  ?  ?  ?  ?  ?  ?  ?Bathroom Shower/Tub: Walk-in shower ?  ?  ?  ?  ?Home Equipment: Shower seat - built in ?  ?  ?  ? ?  ?Prior Functioning/Environment Prior Level of Function : Independent/Modified Independent ?  ?  ?  ?  ?  ?  ?  ?  ?  ? ?  ?  ?OT Problem List: Decreased strength;Decreased range of motion;Impaired UE functional use ?  ?   ?OT Treatment/Interventions:    ?  ?OT Goals(Current goals can be found in the care plan section) Acute Rehab OT Goals ?OT Goal Formulation: All assessment and education complete, DC therapy  ?OT Frequency:   ?  ? ?Co-evaluation   ?  ?  ?  ?  ? ?  ?AM-PAC OT "6 Clicks" Daily Activity     ?Outcome Measure Help from another person eating meals?: A Little ?Help from another person taking care of personal grooming?: None ?Help from another person toileting, which includes using toliet, bedpan, or urinal?: A Little ?Help from another person bathing (including washing, rinsing, drying)?: A Little ?Help from another person to put on and taking off regular upper body clothing?: A Lot ?Help from another person to put on and taking off regular lower body clothing?: A Little ?6 Click Score:  18 ?  ?End of Session Nurse Communication:  (OT education complete) ? ?Activity Tolerance: Patient tolerated treatment well ?Patient left: in chair;with family/visitor present ? ?OT Visit Diagnosis: Muscle weakness (generalized) (M62.81)  ?              ?Time: 5053-9767 ?OT Time Calculation (min): 25 min ?Charges:  OT General Charges ?$OT Visit: 1 Visit ?OT Evaluation ?$OT Eval Low Complexity: 1 Low ?OT Treatments ?$Self Care/Home Management : 8-22 mins ? ?Brenda Samano, OTR/L ?Acute Care Rehab Services  ?Office [phone] ?Pager: [phone]  ? ?Tona Qualley L Nadia Viar ?05/30/2021, 2:18 PM ?

## 2021-05-30 NOTE — Progress Notes (Signed)
AssistedDr. Lowella Petties with left, interscalene  block. Side rails up, monitors on throughout procedure. See vital signs in flow sheet. Tolerated Procedure well. ? ?

## 2021-06-04 ENCOUNTER — Encounter (HOSPITAL_COMMUNITY): Payer: Self-pay | Admitting: Orthopedic Surgery

## 2023-02-13 IMAGING — CT CT SHOULDER*L* W/O CM
1 of 2 series · 9 of 14 positions shown, 12 images · non-contrast
Comparison: None.

CLINICAL DATA: Preop shoulder replacement. Limited range of motion,
weakness

EXAM:
CT OF THE UPPER LEFT EXTREMITY WITHOUT CONTRAST
TECHNIQUE: Multidetector CT imaging of the upper left extremity was performed
according to the standard protocol.
RADIATION DOSE REDUCTION: This exam was performed according to the
departmental dose-optimization program which includes automated
exposure control, adjustment of the mA and/or kV according to
patient size and/or use of iterative reconstruction technique.

[Series 5: thin soft · axial · 0.58mm/px · z∈[-269,-100]mm · 9 of 354 slices shown, 12 images]
[im 36/354  soft-tissue]
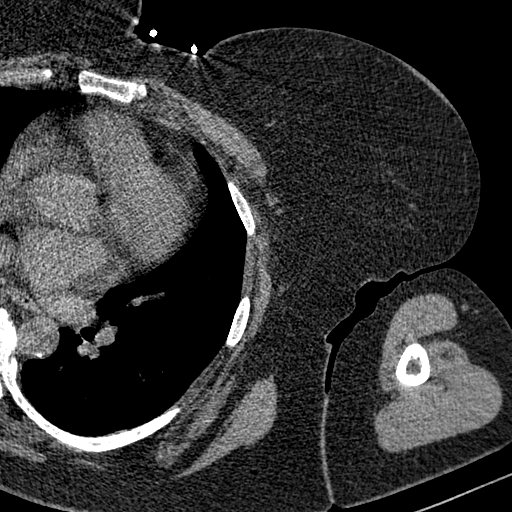
[im 36/354  bone]
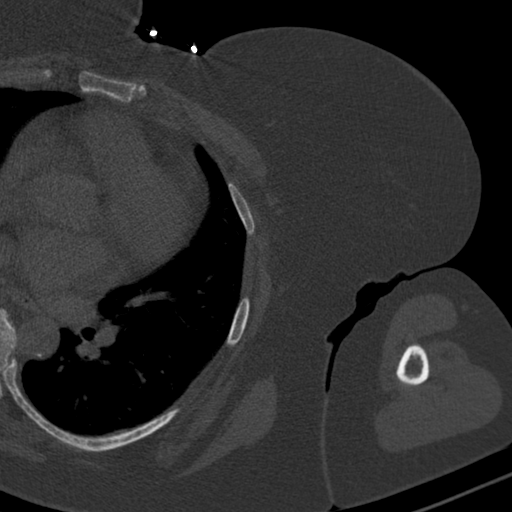
[im 71/354  bone]
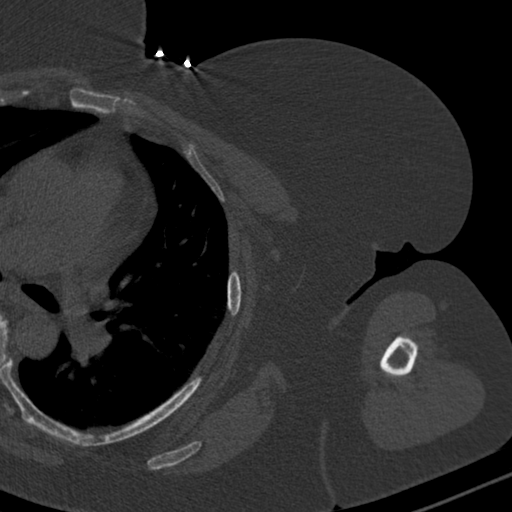
[im 106/354  bone]
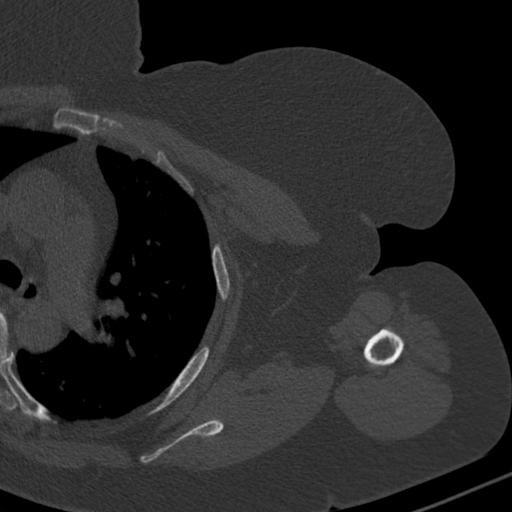
[im 142/354  bone]
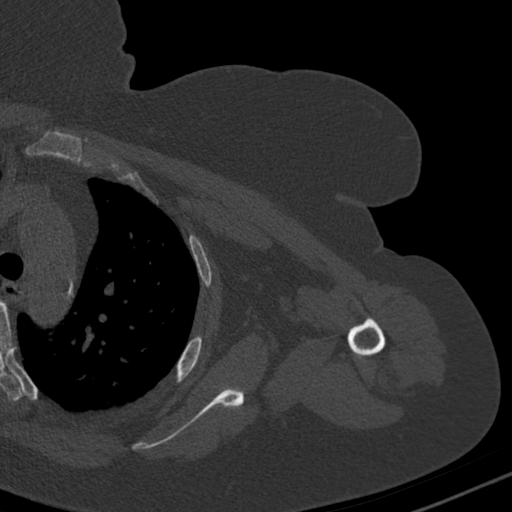
[im 177/354  soft-tissue]
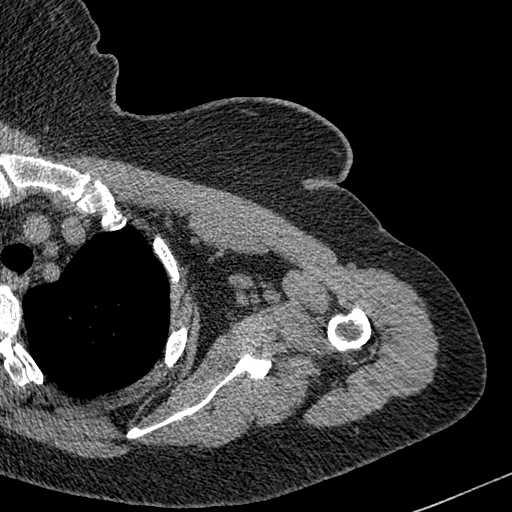
[im 177/354  bone]
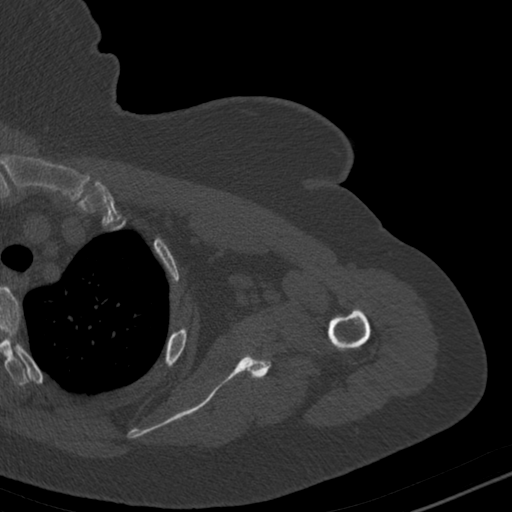
[im 212/354  bone]
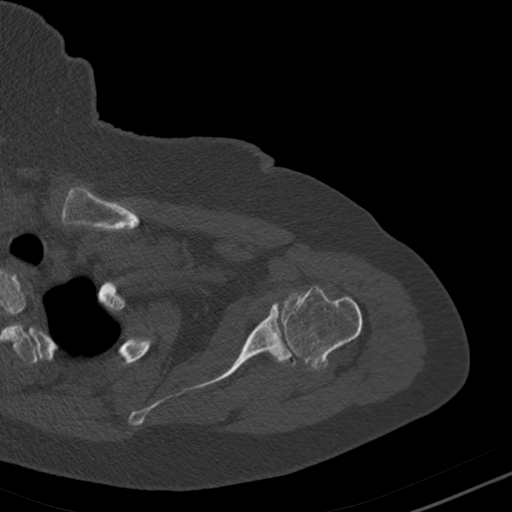
[im 248/354  bone]
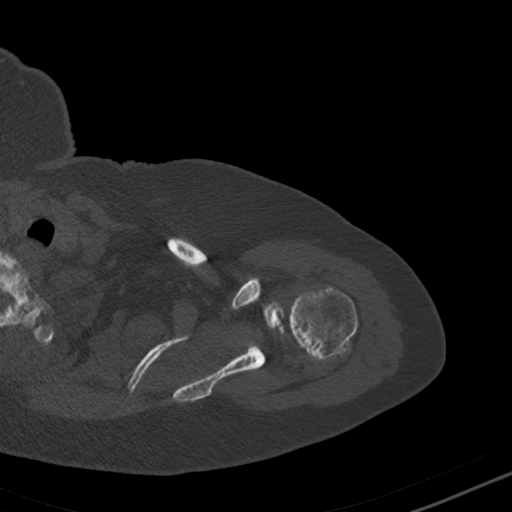
[im 283/354  bone]
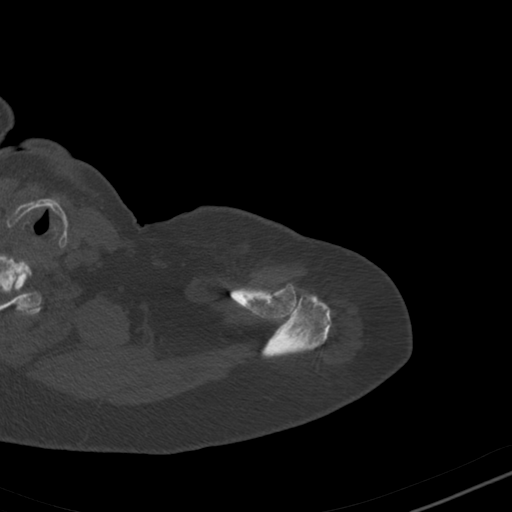
[im 318/354  soft-tissue]
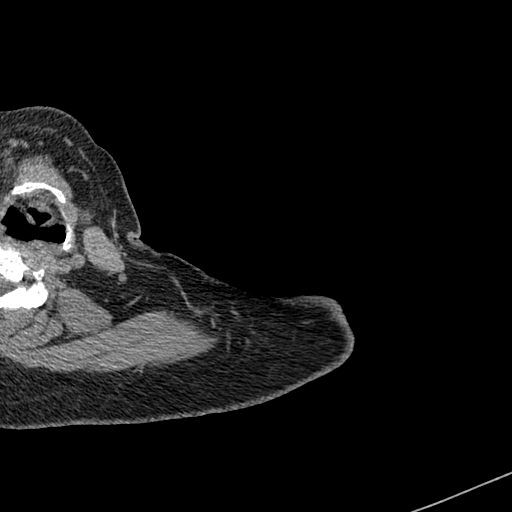
[im 318/354  bone]
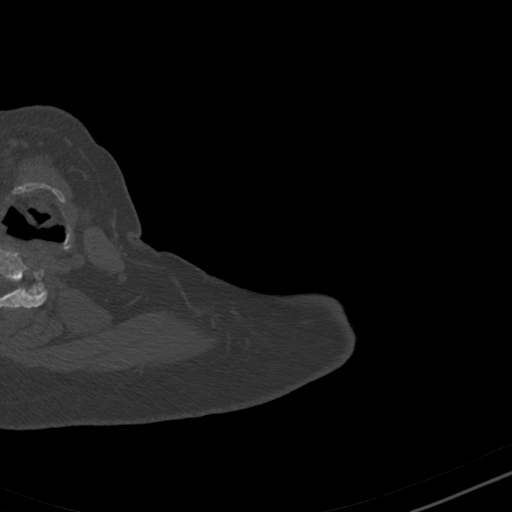

[9 of 14 positions shown; findings below may reference images not displayed]

FINDINGS: Bones/Joint/Cartilage

No fracture or dislocation. Normal alignment. Small joint effusion.

Severe osteoarthritis of the glenohumeral joint with severe joint
space narrowing, a bone-on-bone appearance, subchondral sclerosis,
subchondral cystic changes and marginal osteophytosis.

Mild arthropathy of the acromioclavicular joint.

Ligaments

Ligaments are suboptimally evaluated by CT.

Muscles and Tendons
Muscles are normal.  No muscle atrophy.

Soft tissue
No fluid collection or hematoma. No soft tissue mass. Visualized
portions of the lung are clear. Thoracic aortic atherosclerosis.
IMPRESSION: 1. Severe osteoarthritis of the left glenohumeral joint.
2.  Aortic Atherosclerosis ([id number]).
